# Patient Record
Sex: Male | Born: 1990 | Race: White | Hispanic: No | Marital: Single | Smoking: Never smoker
Health system: Southern US, Community
[De-identification: ages and names within clinical notes are randomized; demographics above are authoritative.]

## PROBLEM LIST (undated history)

## (undated) DIAGNOSIS — Z789 Other specified health status: Secondary | ICD-10-CM

## (undated) HISTORY — PX: VASCULAR SURGERY: SHX849

---

## 2013-10-29 ENCOUNTER — Ambulatory Visit: Payer: Self-pay | Admitting: Physician Assistant

## 2013-11-05 ENCOUNTER — Ambulatory Visit: Payer: Self-pay | Admitting: Physician Assistant

## 2016-11-24 ENCOUNTER — Ambulatory Visit: Payer: Self-pay

## 2016-11-24 ENCOUNTER — Ambulatory Visit (INDEPENDENT_AMBULATORY_CARE_PROVIDER_SITE_OTHER): Payer: Self-pay

## 2016-11-24 ENCOUNTER — Ambulatory Visit
Admission: EM | Admit: 2016-11-24 | Discharge: 2016-11-24 | Disposition: A | Discharge status: Home / Self Care | Payer: Self-pay | Attending: Emergency Medicine | Admitting: Emergency Medicine

## 2016-11-24 ENCOUNTER — Encounter: Payer: Self-pay | Admitting: *Deleted

## 2016-11-24 DIAGNOSIS — S20219D Contusion of unspecified front wall of thorax, subsequent encounter: Secondary | ICD-10-CM

## 2016-11-24 DIAGNOSIS — S60222A Contusion of left hand, initial encounter: Secondary | ICD-10-CM

## 2016-11-24 MED ORDER — HYDROCODONE-ACETAMINOPHEN 5-325 MG PO TABS: 1.0000 | ORAL_TABLET | Freq: Four times a day (QID) | ORAL | 0 refills | Status: DC | PRN

## 2016-11-24 MED ORDER — IBUPROFEN 600 MG PO TABS: 600.0000 mg | ORAL_TABLET | Freq: Four times a day (QID) | ORAL | 0 refills | Status: DC | PRN
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
## 2017-07-27 ENCOUNTER — Ambulatory Visit: Payer: 59

## 2017-07-27 ENCOUNTER — Other Ambulatory Visit: Payer: Self-pay

## 2017-07-27 ENCOUNTER — Ambulatory Visit
Admission: EM | Admit: 2017-07-27 | Discharge: 2017-07-27 | Disposition: A | Discharge status: Home / Self Care | Payer: 59 | Attending: Family Medicine | Admitting: Family Medicine

## 2017-07-27 ENCOUNTER — Encounter: Payer: Self-pay | Admitting: Emergency Medicine

## 2017-07-27 ENCOUNTER — Ambulatory Visit (INDEPENDENT_AMBULATORY_CARE_PROVIDER_SITE_OTHER): Payer: 59

## 2017-07-27 DIAGNOSIS — S63602A Unspecified sprain of left thumb, initial encounter: Secondary | ICD-10-CM

## 2017-07-27 DIAGNOSIS — Y9367 Activity, basketball: Secondary | ICD-10-CM

## 2017-07-27 HISTORY — DX: Other specified health status: Z78.9

## 2017-07-27 NOTE — ED Provider Notes (Signed)
[facility]    CSN: [id number] Arrival date & time: 07/27/17  1153  History   Chief Complaint Chief Complaint  Patient presents with  . thumb pain    APPT   HPI  27 year old male presents with right thumb pain after suffering an injury.  Patient was playing basketball last night.  He went for shot and was blocked.  He subsequently fell forward on an outstretched left hand.  He injured his thumb in the process of doing this.  He has had significant pain and swelling of the thumb.  Difficulty with range of motion.  Pain currently 8/10 in severity.  Worse with range of motion.  No known relieving factors.  No reports of wrist pain.  No numbness or tingling.  No other associated symptoms.  No other complaints this time.  PMH - Varicose veins.  Past Surgical History:  Procedure Laterality Date  . VASCULAR SURGERY     Home Medications    Prior to Admission medications   Not on File   Family History Family History  Problem Relation Age of Onset  . Healthy Mother   . Healthy Father    Social History Social History   Tobacco Use  . Smoking status: Never Smoker  . Smokeless tobacco: Never Used  Substance Use Topics  . Alcohol use: Yes    Alcohol/week: 2.4 oz    Types: 2 Cans of beer, 2 Standard drinks or equivalent per week  . Drug use: No   Allergies   Claritin [loratadine]   Review of Systems Review of Systems  Constitutional: Negative.   Musculoskeletal:       Left thumb pain, swelling, decreased ROM.   Physical Exam Triage Vital Signs ED Triage Vitals  Enc Vitals Group     BP 07/27/17 1217 119/60     Pulse Rate 07/27/17 1217 62     Resp 07/27/17 1217 16     Temp 07/27/17 1217 98.6 F (37 C)     Temp Source 07/27/17 1217 Oral     SpO2 07/27/17 1217 100 %     Weight 07/27/17 1218 230 lb (104.3 kg)     Height 07/27/17 1218 6\' 2"  (1.88 m)     Head Circumference --      Peak Flow --      Pain Score 07/27/17 1217 8     Pain Loc --      Pain  Edu? --      Excl. in GC? --    Updated Vital Signs BP 119/60 (BP Location: Right Arm)   Pulse 62   Temp 98.6 F (37 C) (Oral)   Resp 16   Ht 6\' 2"  (1.88 m)   Wt 230 lb (104.3 kg)   SpO2 100%   BMI 29.53 kg/m     Physical Exam  Constitutional: He is oriented to person, place, and time. He appears well-developed. No distress.  Cardiovascular: Normal rate and regular rhythm.  Pulmonary/Chest: Effort normal. No respiratory distress.  Musculoskeletal:  Left hand and wrist -patient with exquisite tenderness of the thumb distal to the MCP joint.  Sniffing and swelling noted around the MCP joint as well as the thenar eminence.  No bruising. No tenderness of the wrist.  Neurological: He is alert and oriented to person, place, and time.  Psychiatric: He has a normal mood and affect. His behavior is normal.  Nursing note and vitals reviewed.  UC Treatments / Results  Labs (all labs ordered are listed,  but only abnormal results are displayed) Labs Reviewed - No data to display  EKG  EKG Interpretation None       Radiology Dg Wrist Complete Left  Result Date: 07/27/2017 CLINICAL DATA:  Larey Seat last evening playing basketball. Injured left wrist. EXAM: LEFT WRIST - COMPLETE 3+ VIEW COMPARISON:  Hand radiographs 11/24/2016 FINDINGS: The joint spaces are maintained. No acute bony abnormality. No degenerative changes or chondrocalcinosis. IMPRESSION: No acute fracture. Electronically Signed   By: Rudie Meyer M.D.   On: 07/27/2017 12:49   Dg Hand Complete Left  Result Date: 07/27/2017 CLINICAL DATA:  Larey Seat playing basketball and injured left hand last evening. EXAM: LEFT HAND - COMPLETE 3+ VIEW COMPARISON:  11/24/2016 FINDINGS: The joint spaces are maintained.  No acute fracture is identified. IMPRESSION: No acute bony findings. Electronically Signed   By: Rudie Meyer M.D.   On: 07/27/2017 12:48    Procedures Procedures (including critical care time)  Medications Ordered in  UC Medications - No data to display   Initial Impression / Assessment and Plan / UC Course  I have reviewed the triage vital signs and the nursing notes.  Pertinent labs & imaging results that were available during my care of the patient were reviewed by me and considered in my medical decision making (see chart for details).     27 year old male presents with a thumb injury.  X-ray negative.  Placed in thumb spica.  RICE. Ibuprofen as needed.  Final Clinical Impressions(s) / UC Diagnoses   Final diagnoses:  Sprain of left thumb, unspecified site of finger, initial encounter    ED Discharge Orders    None     Controlled Substance Prescriptions [place] Controlled Substance Registry consulted? Not Applicable   Tommie Sams, DO 07/27/17 1409

## 2017-07-27 NOTE — ED Triage Notes (Signed)
Patient in today c/o left thumb pain after a sports injury last night. Patient was playing basketball and fell on left thumb wrong.

## 2017-07-27 NOTE — Discharge Instructions (Signed)
Rest, ice, elevation.  Ibuprofen 800 mg three times daily as needed.  Take care  Dr. Adriana Simasook

## 2018-07-31 IMAGING — CR DG HAND COMPLETE 3+V*L*
3 series · 3 of 3 positions shown · non-contrast
Comparison: 11/24/2016

CLINICAL DATA: Fell playing basketball and injured left hand last
evening.

EXAM:
LEFT HAND - COMPLETE 3+ VIEW

[hand ap]
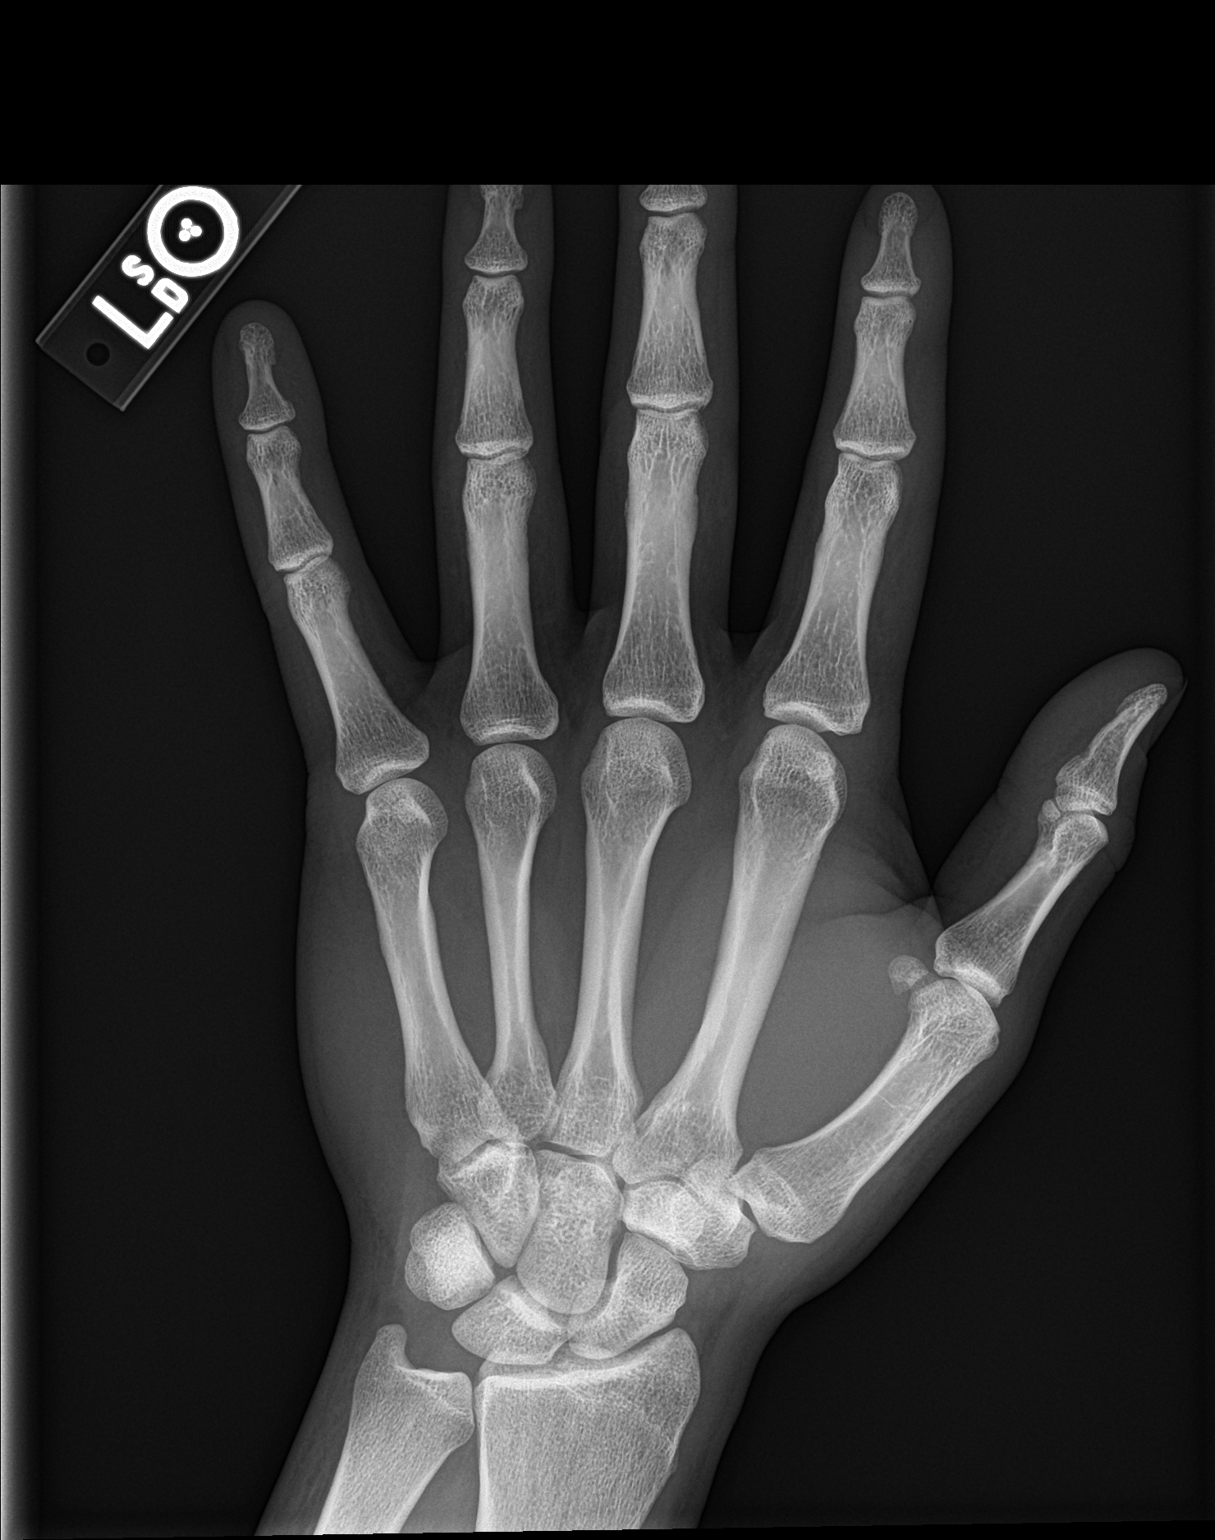

[hand obl]
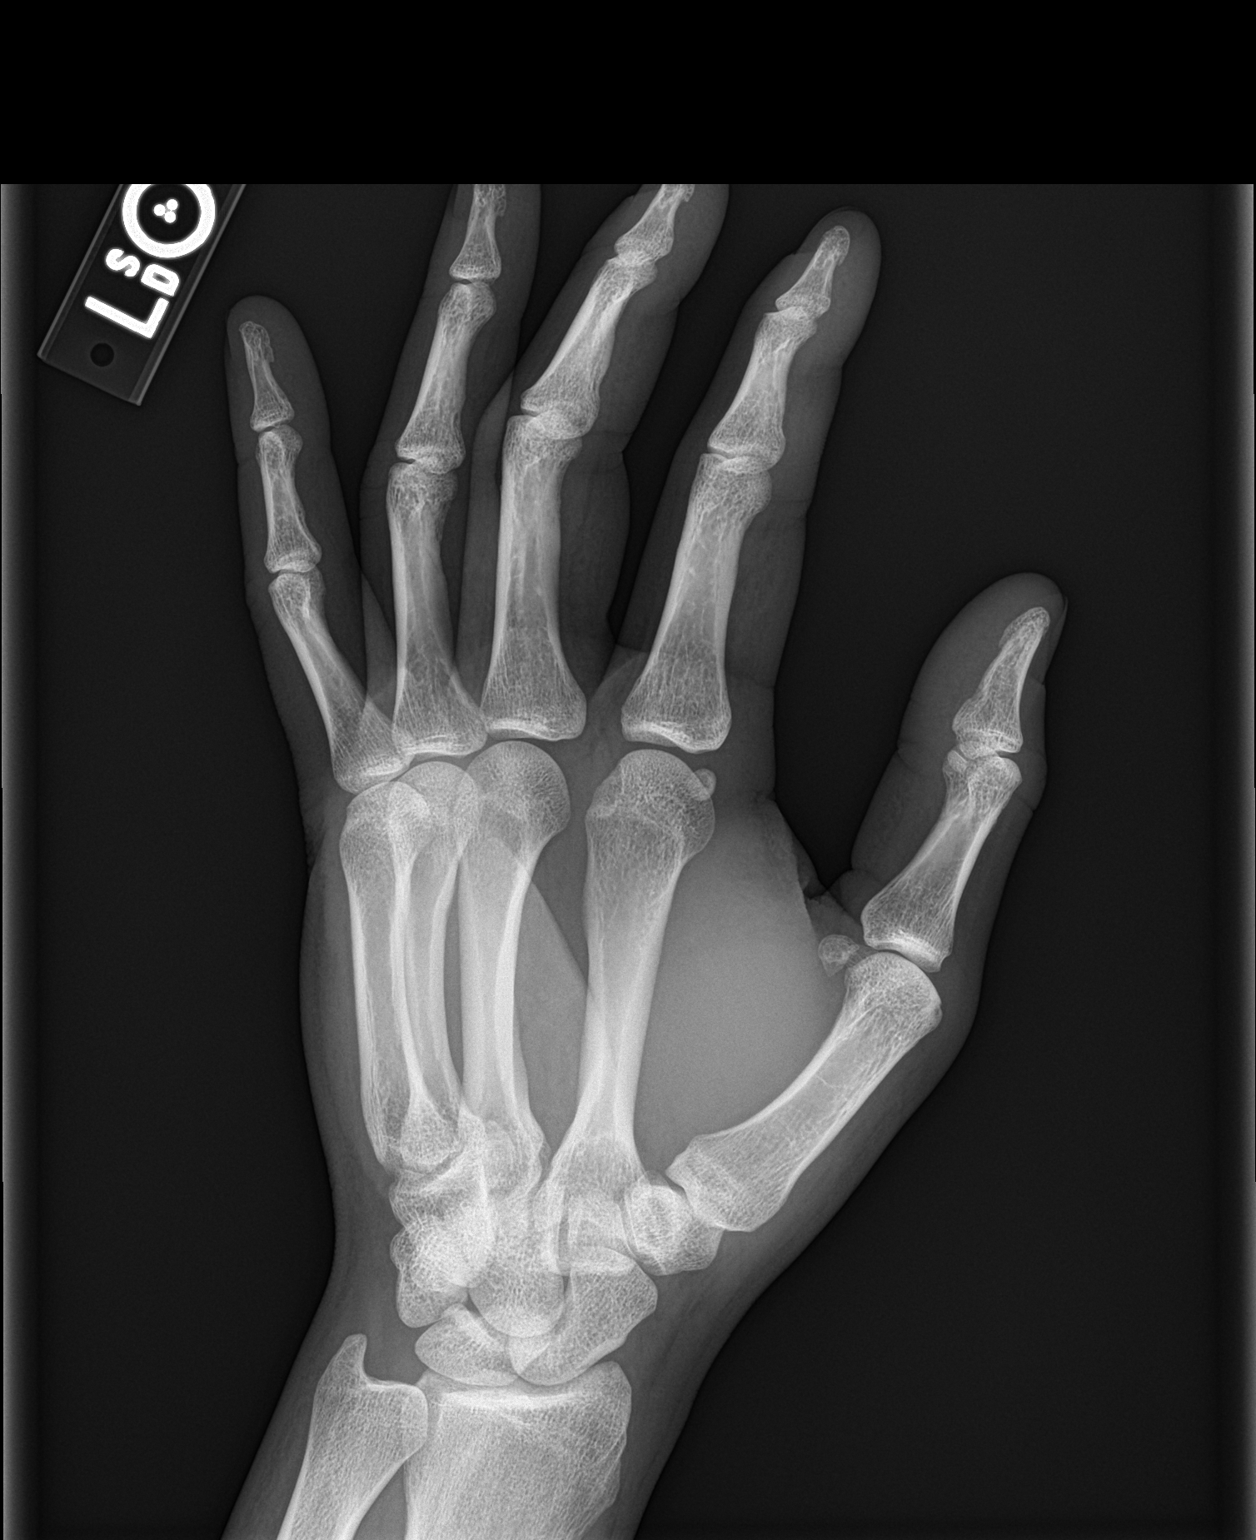

[hand lat]
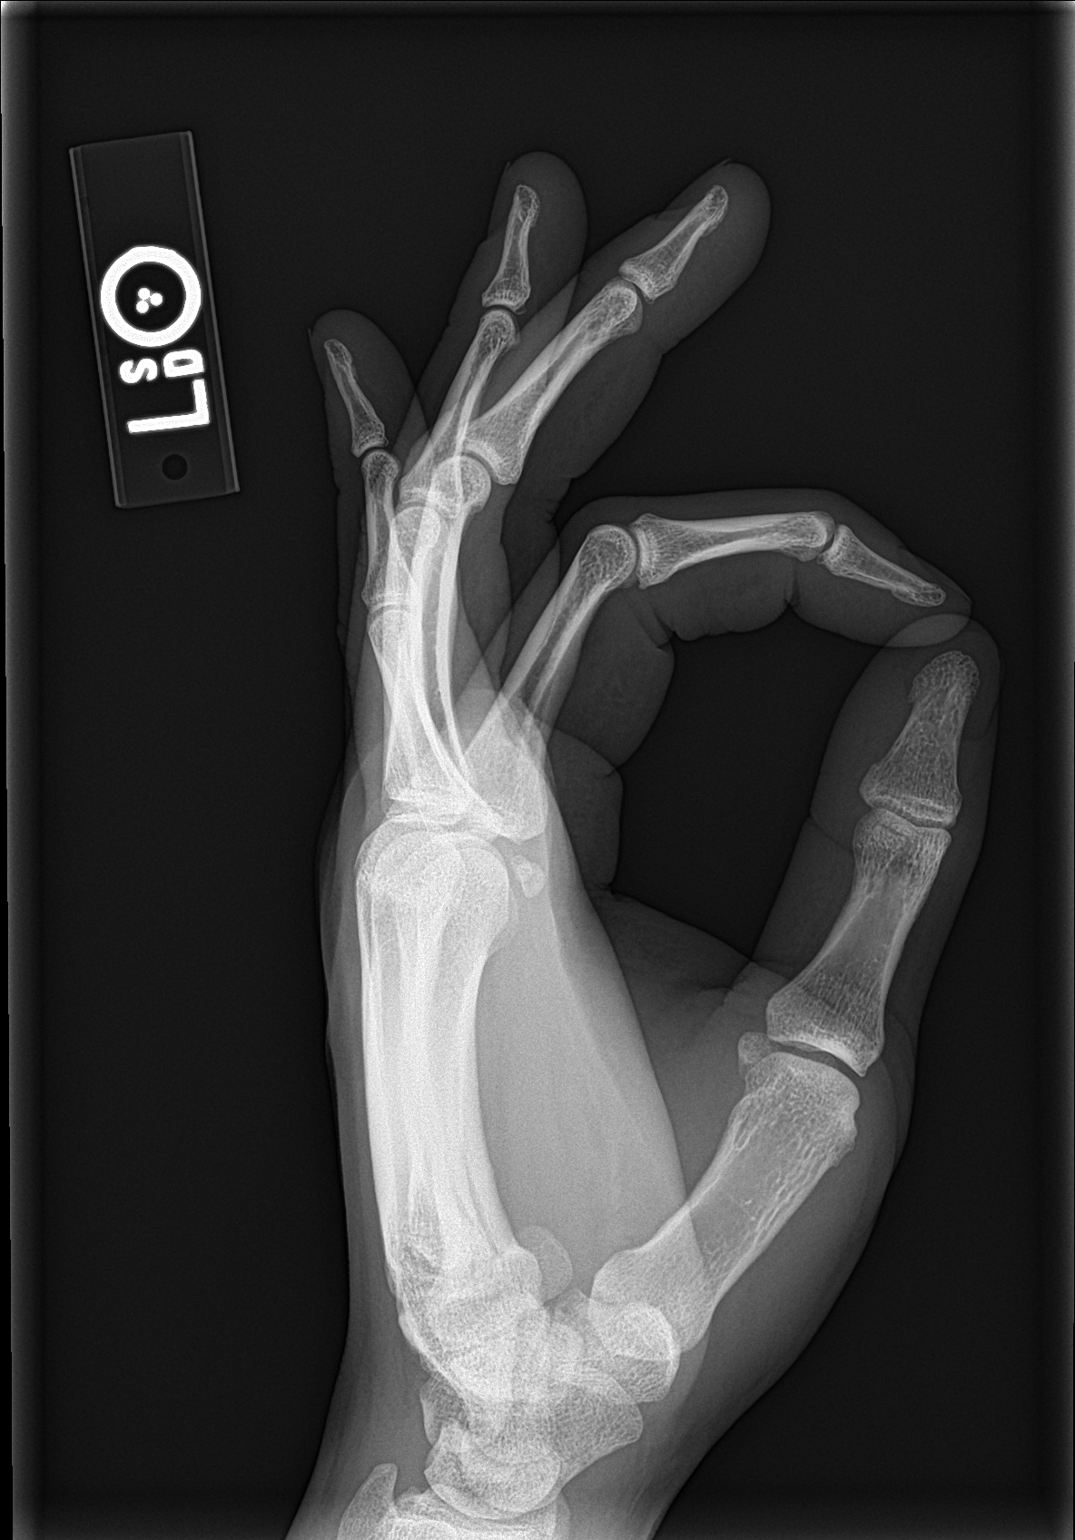

[3 of 3 positions shown; findings below may reference images not displayed]

FINDINGS: The joint spaces are maintained.  No acute fracture is identified.
IMPRESSION: No acute bony findings.

## 2018-07-31 IMAGING — CR DG WRIST COMPLETE 3+V*L*
4 series · 4 of 4 positions shown · non-contrast
Comparison: Hand radiographs 11/24/2016

CLINICAL DATA: Fell last evening playing basketball. Injured left
wrist.

EXAM:
LEFT WRIST - COMPLETE 3+ VIEW

[wrist pa]
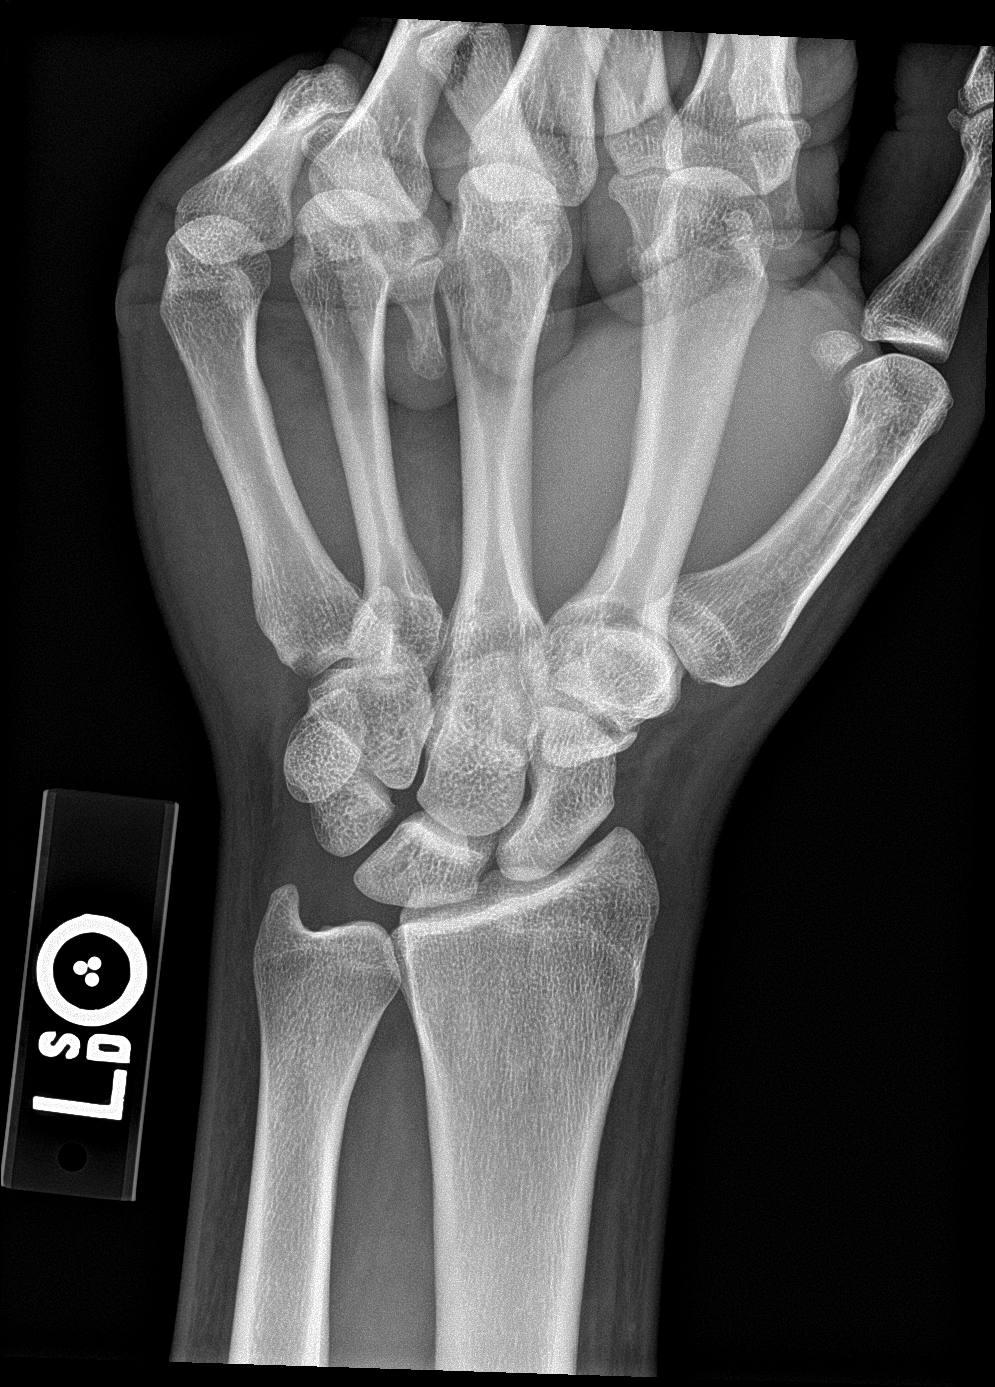

[wrist obl]
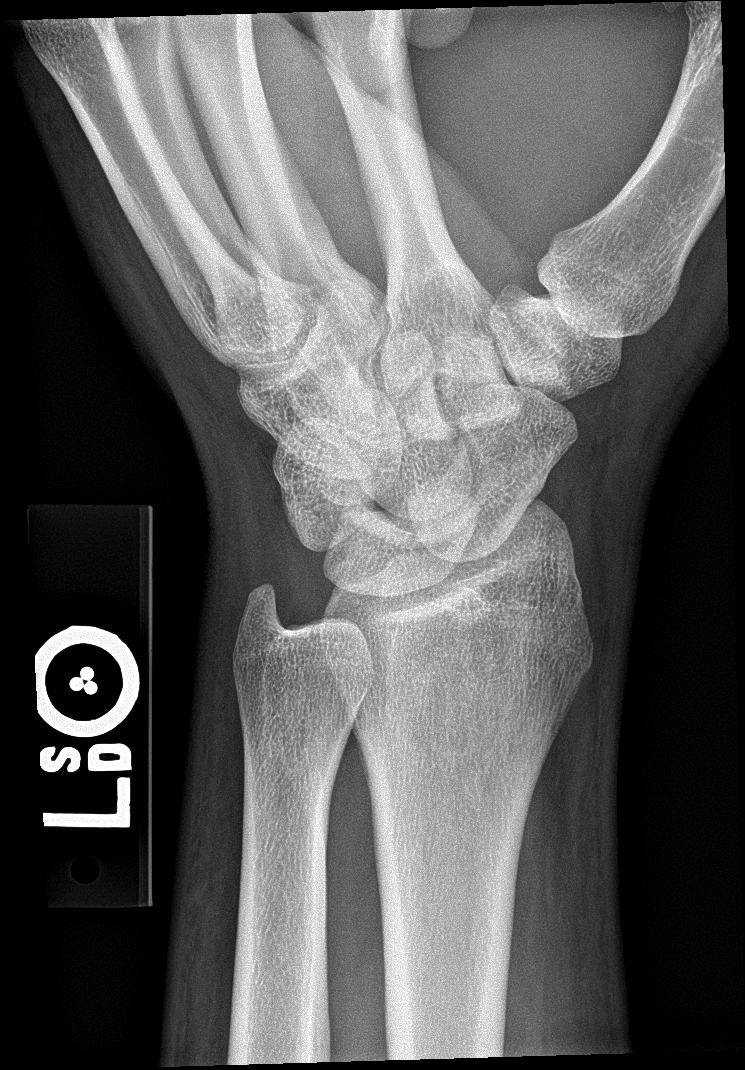

[wrist lat]
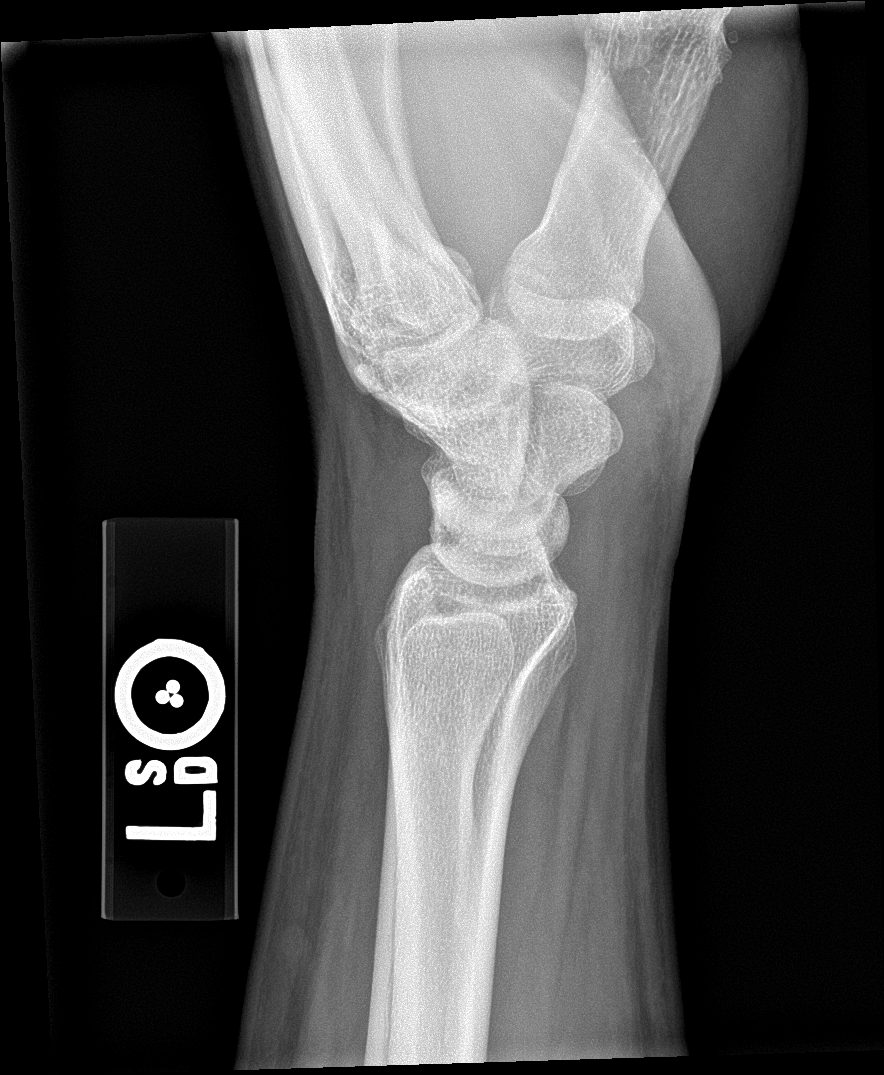

[wrist navicular]
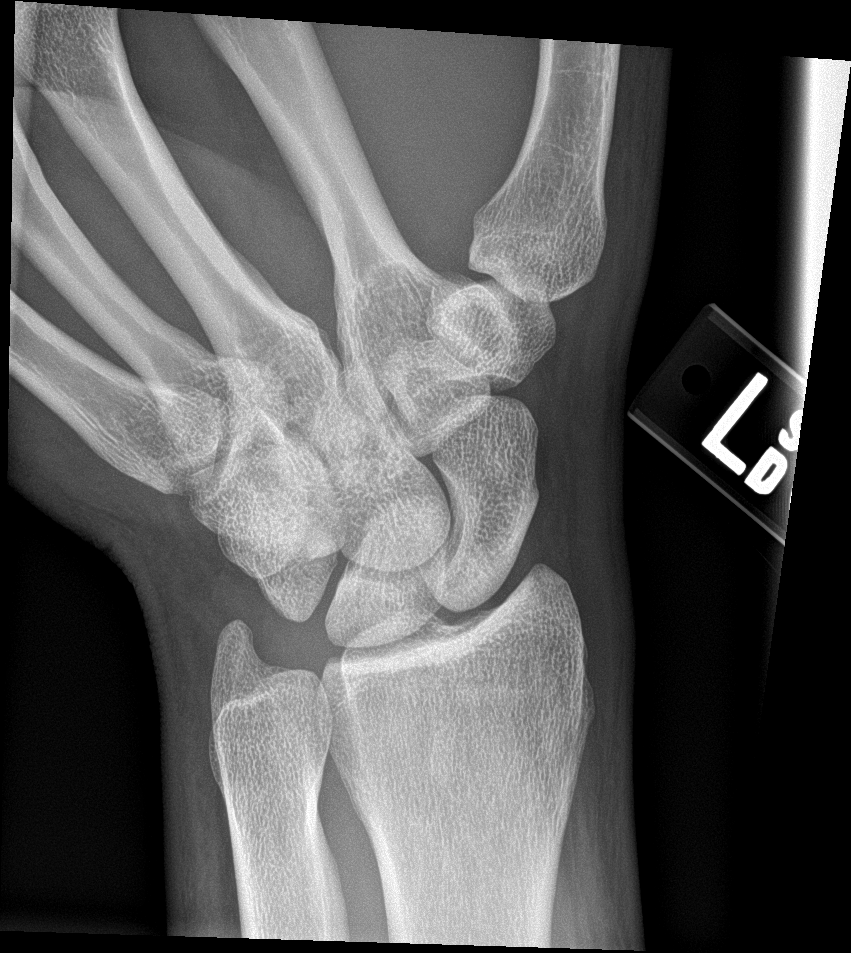

[4 of 4 positions shown; findings below may reference images not displayed]

FINDINGS: The joint spaces are maintained. No acute bony abnormality. No
degenerative changes or chondrocalcinosis.
IMPRESSION: No acute fracture.
